# Patient Record
Sex: Male | Born: 1995 | Race: White | Hispanic: No | Marital: Single | Smoking: Never smoker
Health system: Southern US, Community
[De-identification: ages and names within clinical notes are randomized; demographics above are authoritative.]

## PROBLEM LIST (undated history)

## (undated) DIAGNOSIS — T7840XA Allergy, unspecified, initial encounter: Secondary | ICD-10-CM

## (undated) HISTORY — PX: TONSILLECTOMY: SUR1361

## (undated) HISTORY — DX: Allergy, unspecified, initial encounter: T78.40XA

---
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
## 2015-11-27 ENCOUNTER — Ambulatory Visit (INDEPENDENT_AMBULATORY_CARE_PROVIDER_SITE_OTHER): Payer: BLUE CROSS/BLUE SHIELD | Admitting: Physician Assistant

## 2015-11-27 VITALS — BP 130/70 | HR 92 | Temp 97.3°F | Resp 16 | Ht 69.5 in | Wt 157.0 lb

## 2015-11-27 DIAGNOSIS — H6982 Other specified disorders of Eustachian tube, left ear: Secondary | ICD-10-CM | POA: Diagnosis not present

## 2015-11-27 DIAGNOSIS — J209 Acute bronchitis, unspecified: Secondary | ICD-10-CM

## 2015-11-27 DIAGNOSIS — J4520 Mild intermittent asthma, uncomplicated: Secondary | ICD-10-CM

## 2015-11-27 DIAGNOSIS — H6992 Unspecified Eustachian tube disorder, left ear: Secondary | ICD-10-CM

## 2015-11-27 MED ORDER — ALBUTEROL SULFATE HFA 108 (90 BASE) MCG/ACT IN AERS: 2.0000 | INHALATION_SPRAY | Freq: Four times a day (QID) | RESPIRATORY_TRACT | Status: AC | PRN

## 2015-11-27 MED ORDER — BENZONATATE 100 MG PO CAPS: 100.0000 mg | ORAL_CAPSULE | Freq: Three times a day (TID) | ORAL | Status: DC | PRN

## 2015-11-27 MED ORDER — IPRATROPIUM BROMIDE 0.03 % NA SOLN: 2.0000 | Freq: Two times a day (BID) | NASAL | Status: DC

## 2015-11-27 MED ORDER — HYDROCOD POLST-CPM POLST ER 10-8 MG/5ML PO SUER
5.0000 mL | Freq: Two times a day (BID) | ORAL | Status: DC | PRN
Start: 2015-11-27 — End: 2016-02-03

## 2015-11-27 MED ORDER — AZITHROMYCIN 250 MG PO TABS
ORAL_TABLET | ORAL | Status: AC
Start: 2015-11-27 — End: 2015-12-02

## 2015-11-27 NOTE — Progress Notes (Signed)
[facility] [address], [place] [id number] [phone]  Date:  11/27/2015   Name:  Dylan ChristmasBryce M Worrel   DOB:  07/17/1996   MRN:  [id number]  PCP:  No PCP Per Patient    Chief Complaint: Hearing Loss; Nasal Congestion; and Cough   History of Present Illness:  This is a 20 y.o. male with PMH allergic rhinitis who is presenting with nasal congestion and cough x 2 weeks. States cough is productive of dark yellow/green mucus. Feels like symptoms are staying the same. Had fever for first 3 days of illness. No fever since. Gets wheezing every now and then. Does have a hx of asthma. States he has not had an inhaler in 6 years. No sob. States he is feeling very fatigued since this started. 1 week ago started having left ear fullness. No pain.  Denies sore throat.  Aggravating/alleviating factors: dayquil and not helping. History of env allergies: yes, uses flonase and claritin year round. Tobacco use: no  Review of Systems:  Review of Systems See HPI  There are no active problems to display for this patient.   Prior to Admission medications   Medication Sig Start Date End Date Taking? Authorizing Provider  loratadine (CLARITIN) 10 MG tablet Take 10 mg by mouth daily.   Yes Historical Provider, MD                         No Known Allergies  Past Surgical History  Procedure Laterality Date  . Tonsillectomy      Social History  Substance Use Topics  . Smoking status: Never Smoker   . Smokeless tobacco: None  . Alcohol Use: No    History reviewed. No pertinent family history.  Medication list has been reviewed and updated.  Physical Examination:  Physical Exam  Constitutional: He is oriented to person, place, and time. He appears well-developed and well-nourished. No distress.  HENT:  Head: Normocephalic and atraumatic.  Right Ear: Hearing, tympanic membrane, external ear and ear canal normal.  Left Ear: Hearing, external ear and ear canal  normal. Tympanic membrane is retracted.  Nose: Mucosal edema present. Right sinus exhibits no maxillary sinus tenderness and no frontal sinus tenderness. Left sinus exhibits no maxillary sinus tenderness and no frontal sinus tenderness.  Mouth/Throat: Uvula is midline, oropharynx is clear and moist and mucous membranes are normal.  Eyes: Conjunctivae and lids are normal. Right eye exhibits no discharge. Left eye exhibits no discharge. No scleral icterus.  Cardiovascular: Normal rate, regular rhythm, normal heart sounds and normal pulses.   No murmur heard. Pulmonary/Chest: Effort normal. No respiratory distress. He has no wheezes. He has rhonchi (Left sided). He has no rales.  Musculoskeletal: Normal range of motion.  Lymphadenopathy:       Head (right side): No submental, no submandibular and no tonsillar adenopathy present.       Head (left side): No submental, no submandibular and no tonsillar adenopathy present.    He has no cervical adenopathy.  Neurological: He is alert and oriented to person, place, and time.  Skin: Skin is warm, dry and intact. No lesion and no rash noted.  Psychiatric: He has a normal mood and affect. His speech is normal and behavior is normal. Thought content normal.   BP 130/70 mmHg  Pulse 92  Temp(Src) 97.3 F (36.3 C) (Oral)  Resp 16  Ht 5' 9.5" (1.765 m)  Wt 157 lb (71.215 kg)  BMI 22.86 kg/m2  SpO2 98%  Assessment and Plan:  1. ETD (eustachian tube dysfunction), left atrovent bid in addition to flonase daily. Sudafed 12 hour QAM for next 7-14 days. Continue claritin daily. Return if not getting better in 2-4 weeks. - ipratropium (ATROVENT) 0.03 % nasal spray; Place 2 sprays into both nostrils 2 (two) times daily.  Dispense: 30 mL; Refill: 0  2. Acute bronchitis, unspecified organism Treat with meds below. Return in 7-10 days if symptoms do not improve or at any time if symptoms worsen.  - azithromycin (ZITHROMAX) 250 MG tablet; Take 2 tabs PO x 1  dose, then 1 tab PO QD x 4 days  Dispense: 6 tablet; Refill: 0 - chlorpheniramine-HYDROcodone (TUSSIONEX PENNKINETIC ER) 10-8 MG/5ML SUER; Take 5 mLs by mouth every 12 (twelve) hours as needed for cough.  Dispense: 100 mL; Refill: 0 - benzonatate (TESSALON) 100 MG capsule; Take 1-2 capsules (100-200 mg total) by mouth 3 (three) times daily as needed for cough.  Dispense: 40 capsule; Refill: 0  3. Asthma, mild intermittent, uncomplicated Pt with mild int asthma but no inhaler at home. Refilled. - albuterol (PROVENTIL HFA;VENTOLIN HFA) 108 (90 Base) MCG/ACT inhaler; Inhale 2 puffs into the lungs every 6 (six) hours as needed for wheezing (cough, shortness of breath or wheezing.).  Dispense: 1 Inhaler; Refill: 1   Nelsie Domino V. Dyke Brackett, MHS [facility]  11/27/2015

## 2015-11-27 NOTE — Patient Instructions (Addendum)
Continue claritin and flonase Use atrovent nasal spray twice a day in addition to flonase. Take 12 hour sudafed once a day in the morning for next 1-2 weeks. Antibiotic as prescribed. Cough syrup at night for sleep. Drink plenty of water (64 oz/day) and get plenty of rest. If your symptoms are not improving in 7-10 days, return to clinic.  IF you received an x-ray today, you will receive an invoice from [facility]. Please contact [facility] at [phone] with questions or concerns regarding your invoice.   IF you received labwork today, you will receive an invoice from [facility]. Please contact [facility] at [phone] with questions or concerns regarding your invoice.   Our billing staff will not be able to assist you with questions regarding bills from these companies.  You will be contacted with the lab results as soon as they are available. The fastest way to get your results is to activate your My Chart account. Instructions are located on the last page of this paperwork. If you have not heard from us regarding the results in 2 weeks, please contact this office.

## 2016-02-03 ENCOUNTER — Ambulatory Visit (INDEPENDENT_AMBULATORY_CARE_PROVIDER_SITE_OTHER): Payer: BLUE CROSS/BLUE SHIELD

## 2016-02-03 ENCOUNTER — Ambulatory Visit (INDEPENDENT_AMBULATORY_CARE_PROVIDER_SITE_OTHER): Payer: BLUE CROSS/BLUE SHIELD | Admitting: Family Medicine

## 2016-02-03 VITALS — BP 136/70 | HR 111 | Temp 98.0°F | Resp 16 | Ht 69.5 in | Wt 158.8 lb

## 2016-02-03 DIAGNOSIS — M25561 Pain in right knee: Secondary | ICD-10-CM

## 2016-02-03 DIAGNOSIS — S8001XA Contusion of right knee, initial encounter: Secondary | ICD-10-CM

## 2016-02-03 NOTE — Patient Instructions (Addendum)
     IF you received an x-ray today, you will receive an invoice from [facility]. Please contact [facility] at [phone] with questions or concerns regarding your invoice.   IF you received labwork today, you will receive an invoice from [facility]. Please contact [facility] at [phone] with questions or concerns regarding your invoice.   Our billing staff will not be able to assist you with questions regarding bills from these companies.  You will be contacted with the lab results as soon as they are available. The fastest way to get your results is to activate your My Chart account. Instructions are located on the last page of this paperwork. If you have not heard from us regarding the results in 2 weeks, please contact this office.     I do not see any fractures or concerning findings on your knee x-ray today. There is a possibility that she just continues or bruises the kneecap. Okay for range of motion and gentle strengthening as we discussed. Over-the-counter naproxen or ibuprofen as needed, ice at the end of the day if needed, and Ace bandage if this feels better.  If you have any locking, giving way, or worsening pain, recommend recheck. In the meantime, be careful with lifts while dancing as there is a potential for instability.
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
## 2017-01-03 ENCOUNTER — Ambulatory Visit: Payer: BLUE CROSS/BLUE SHIELD

## 2017-01-08 ENCOUNTER — Ambulatory Visit: Payer: BLUE CROSS/BLUE SHIELD | Admitting: Family Medicine

## 2017-01-15 ENCOUNTER — Ambulatory Visit (INDEPENDENT_AMBULATORY_CARE_PROVIDER_SITE_OTHER): Payer: BLUE CROSS/BLUE SHIELD | Admitting: Family Medicine

## 2017-01-15 ENCOUNTER — Encounter: Payer: Self-pay | Admitting: Family Medicine

## 2017-01-15 VITALS — BP 131/81 | HR 78 | Temp 98.4°F | Resp 17 | Ht 69.5 in | Wt 162.0 lb

## 2017-01-15 DIAGNOSIS — L659 Nonscarring hair loss, unspecified: Secondary | ICD-10-CM | POA: Diagnosis not present

## 2017-01-15 DIAGNOSIS — L80 Vitiligo: Secondary | ICD-10-CM | POA: Diagnosis not present

## 2017-01-15 DIAGNOSIS — J4520 Mild intermittent asthma, uncomplicated: Secondary | ICD-10-CM | POA: Diagnosis not present

## 2017-01-15 DIAGNOSIS — L700 Acne vulgaris: Secondary | ICD-10-CM

## 2017-01-15 NOTE — Patient Instructions (Addendum)
I will check thyroid and autoimmune disease tests, and will let you know the results within the next 2 weeks.   Keep follow-up with your dermatologist.   IF you received an x-ray today, you will receive an invoice from [facility]. Please contact [facility] at [phone] with questions or concerns regarding your invoice.   IF you received labwork today, you will receive an invoice from [facility]. Please contact [facility] at [phone] with questions or concerns regarding your invoice.   Our billing staff will not be able to assist you with questions regarding bills from these companies.  You will be contacted with the lab results as soon as they are available. The fastest way to get your results is to activate your My Chart account. Instructions are located on the last page of this paperwork. If you have not heard from us regarding the results in 2 weeks, please contact this office.
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
## 2017-01-16 LAB — THYROID PANEL
FREE THYROXINE INDEX: 2 (ref 1.2–4.9)
T3 UPTAKE RATIO: 28 % (ref 24–39)
T4, Total: 7.1 ug/dL (ref 4.5–12.0)

## 2017-01-16 LAB — ANA,IFA RA DIAG PNL W/RFLX TIT/PATN
ANA Titer 1: NEGATIVE
Cyclic Citrullin Peptide Ab: 7 units (ref 0–19)
Rhuematoid fact SerPl-aCnc: <10 IU/mL (ref 0.0–13.9)

## 2017-09-16 IMAGING — CR DG KNEE COMPLETE 4+V*R*
4 series · 4 of 4 positions shown · non-contrast
Comparison: None.

CLINICAL DATA: Acute right knee pain after fall last week. Initial
encounter.

EXAM:
RIGHT KNEE - COMPLETE 4+ VIEW

[AP]
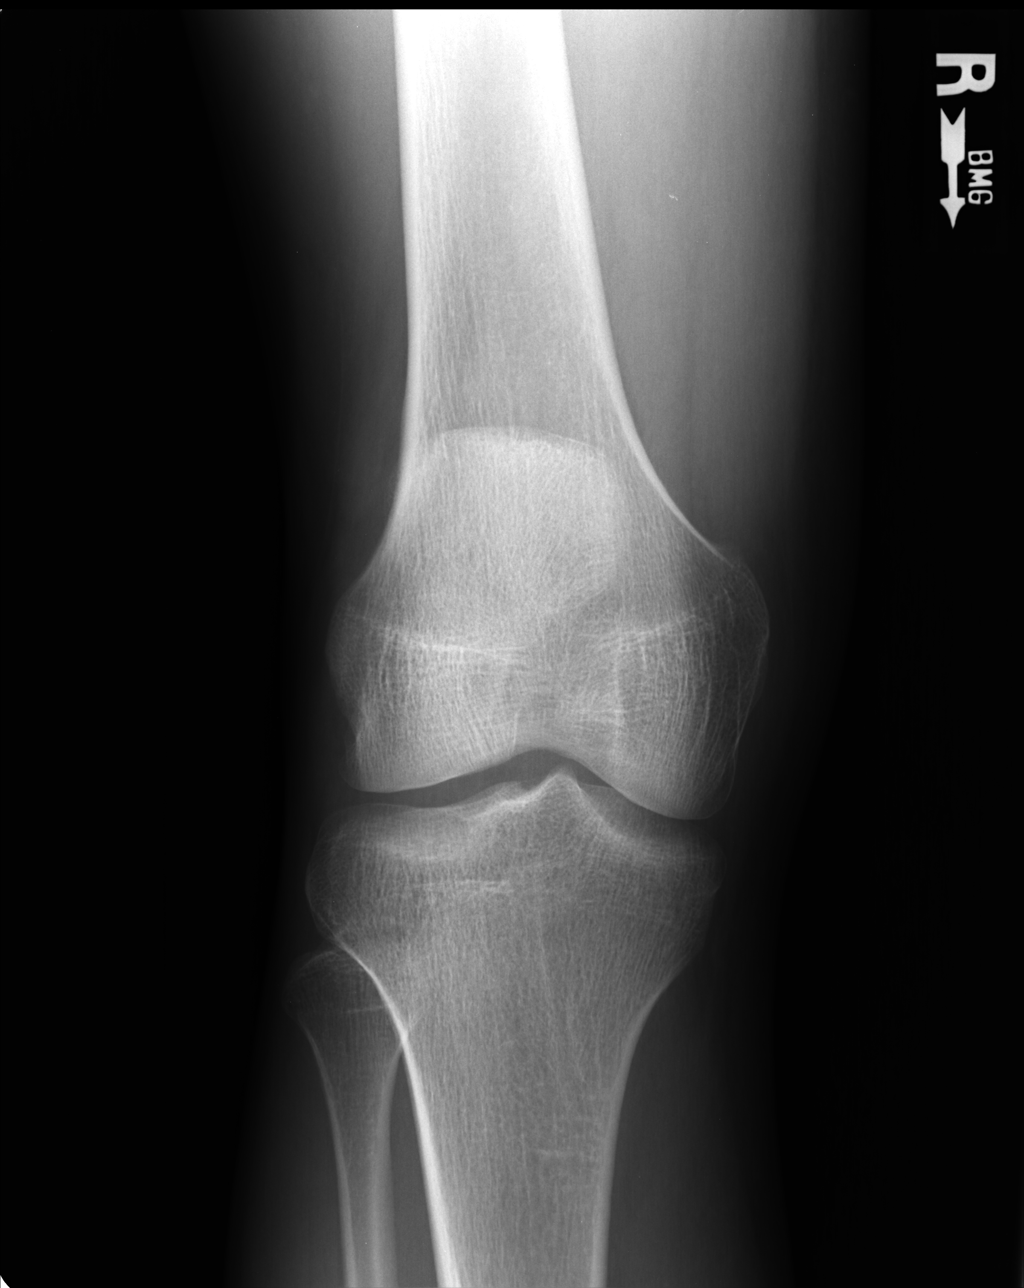

[ap axial]
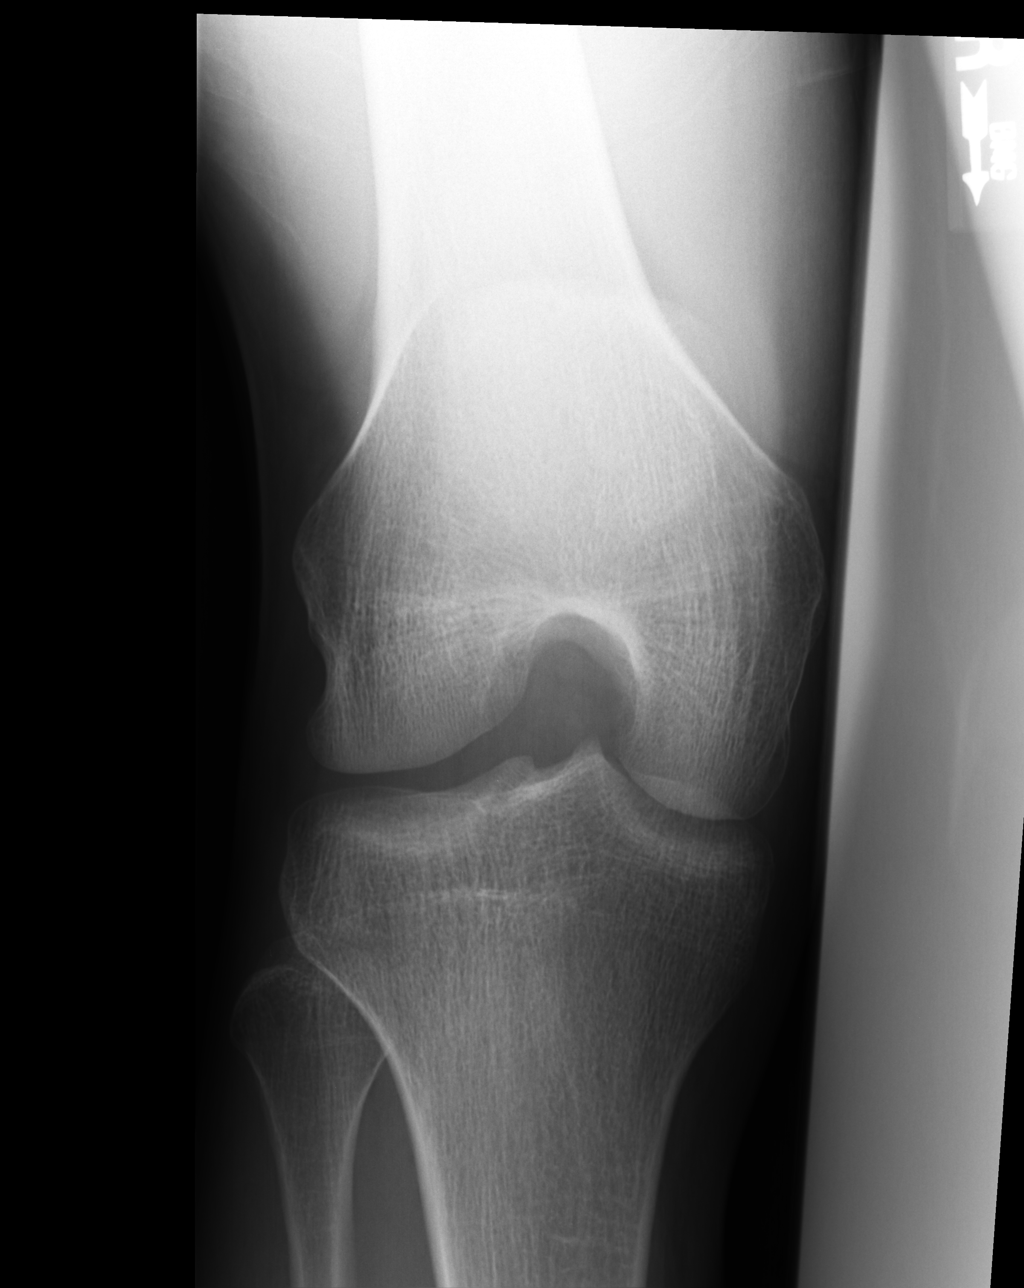

[lateral]
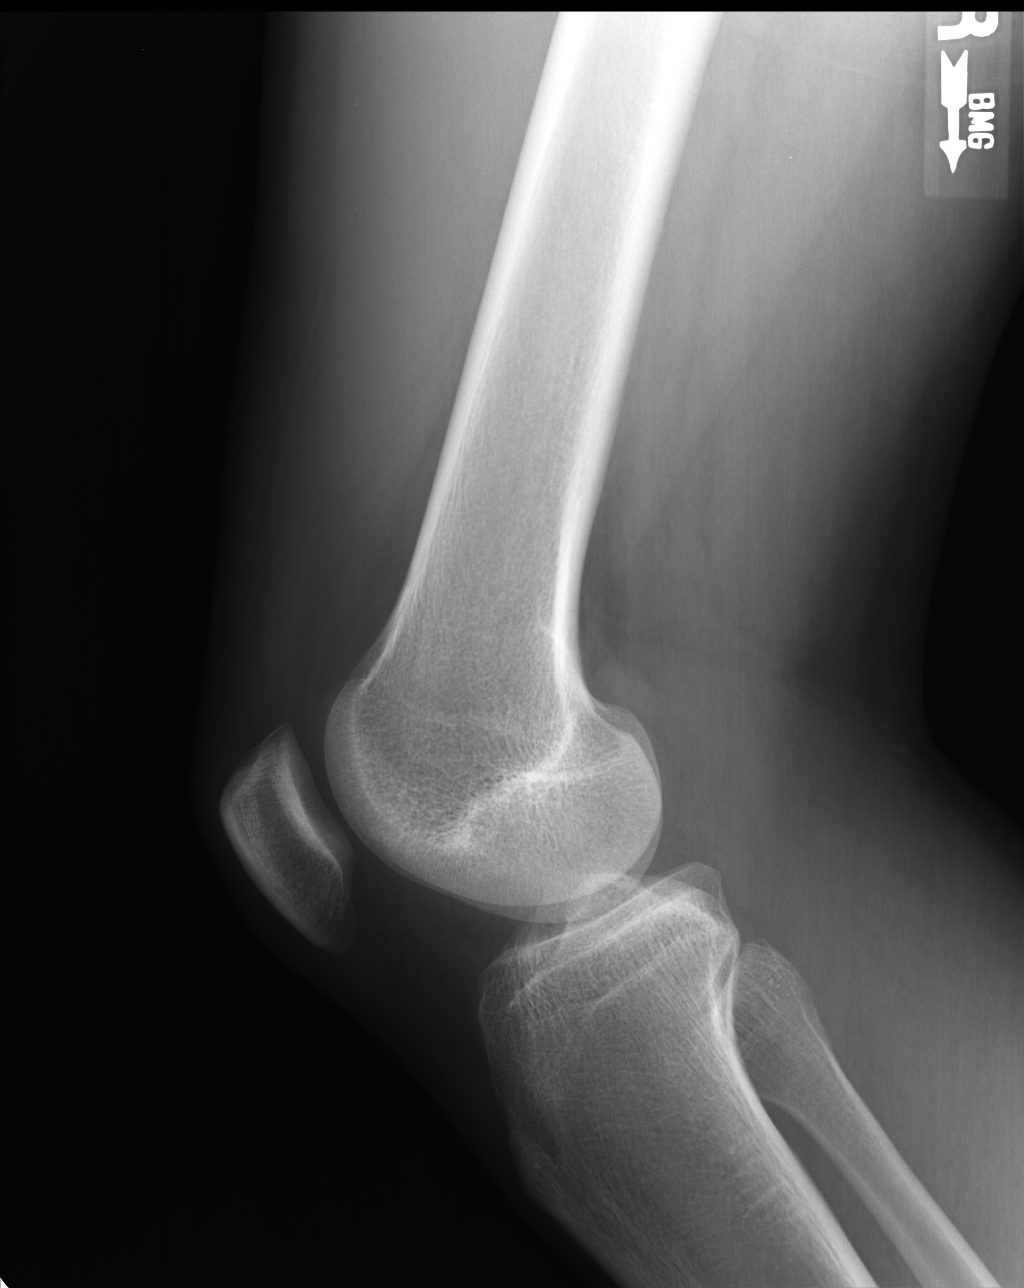

[sunrise]
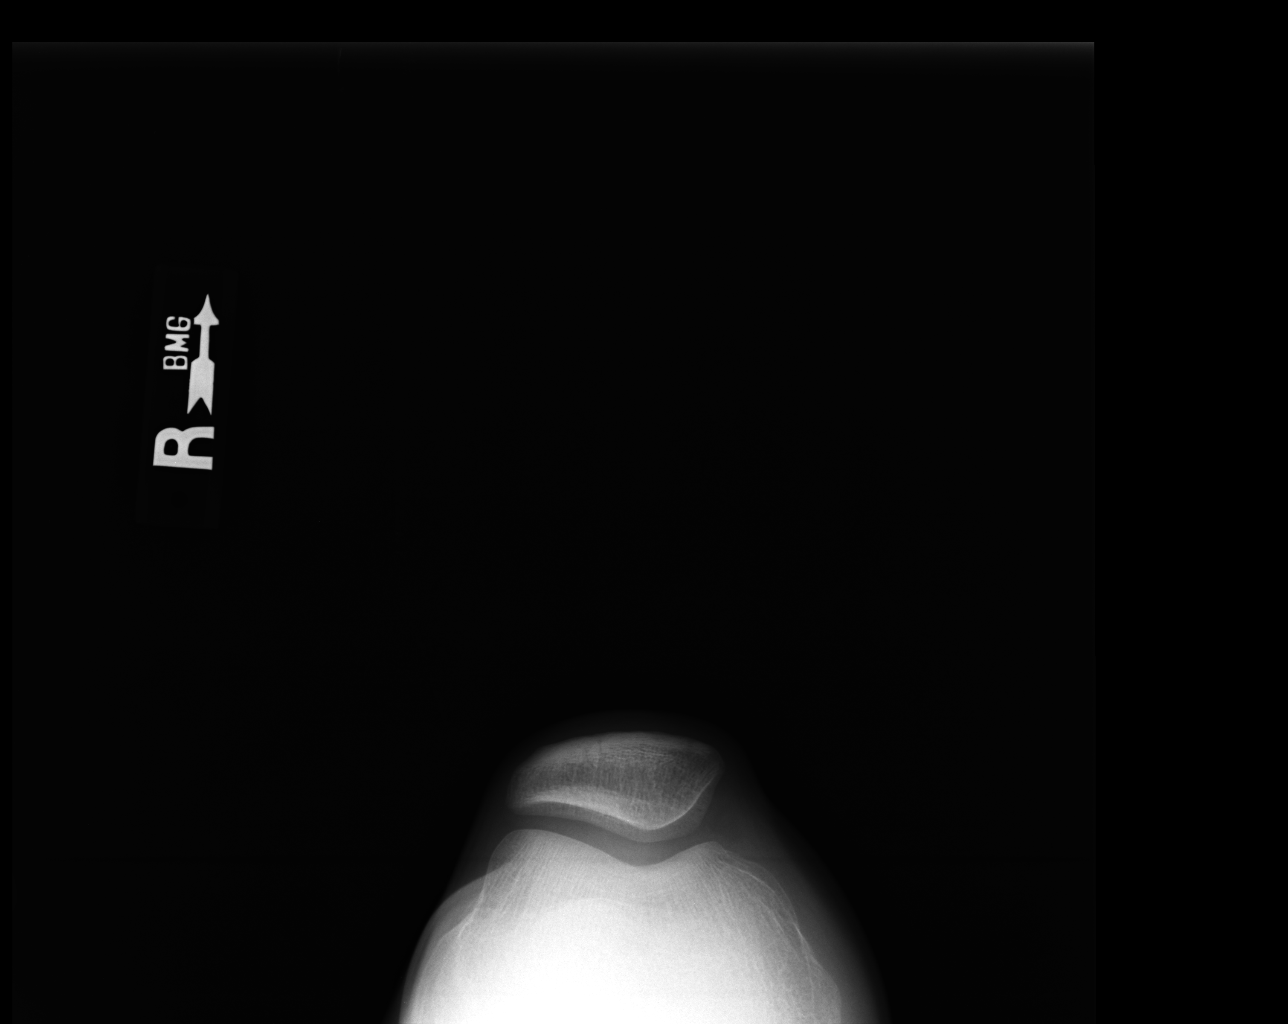

[4 of 4 positions shown; findings below may reference images not displayed]

FINDINGS: No evidence of fracture, dislocation, or joint effusion. No evidence
of arthropathy or other focal bone abnormality. Soft tissues are
unremarkable.
IMPRESSION: Normal right knee.

## 2017-11-22 ENCOUNTER — Other Ambulatory Visit: Payer: Self-pay

## 2017-11-22 ENCOUNTER — Encounter: Payer: Self-pay | Admitting: Physician Assistant

## 2017-11-22 ENCOUNTER — Ambulatory Visit: Payer: BLUE CROSS/BLUE SHIELD | Admitting: Physician Assistant

## 2017-11-22 VITALS — BP 143/84 | HR 73 | Temp 98.5°F | Resp 17 | Ht 69.5 in | Wt 158.0 lb

## 2017-11-22 DIAGNOSIS — L859 Epidermal thickening, unspecified: Secondary | ICD-10-CM

## 2017-11-22 MED ORDER — HYDROCORTISONE 2.5 % EX OINT: TOPICAL_OINTMENT | Freq: Two times a day (BID) | CUTANEOUS | 0 refills | Status: AC

## 2017-11-22 NOTE — Patient Instructions (Addendum)
apply the ointment twice daily until the lesions disappear.    IF you received an x-ray today, you will receive an invoice from [facility]. Please contact [facility] at [phone] with questions or concerns regarding your invoice.   IF you received labwork today, you will receive an invoice from [facility]. Please contact [facility] at [phone] with questions or concerns regarding your invoice.   Our billing staff will not be able to assist you with questions regarding bills from these companies.  You will be contacted with the lab results as soon as they are available. The fastest way to get your results is to activate your My Chart account. Instructions are located on the last page of this paperwork. If you have not heard from us regarding the results in 2 weeks, please contact this office.

## 2017-11-22 NOTE — Progress Notes (Signed)
    11/22/2017 9:56 AM   DOB: 11/29/1995 / MRN: [id number]  SUBJECTIVE:  Dylan Parker is a 22 y.o. male presenting for a few small lesions about the right skin overlying the second and third MCP.  He denies any pain.  He does have a history of eczema.  He has No Known Allergies.   He  has a past medical history of Allergy and Asthma.    He  reports that  has never smoked. he has never used smokeless tobacco. He reports that he does not drink alcohol or use drugs. He  reports that he does not engage in sexual activity. The patient  has a past surgical history that includes Tonsillectomy.  His family history is not on file.  ROS: He denies any weakness in rash elsewhere.  The problem list and medications were reviewed and updated by myself where necessary and exist elsewhere in the encounter.   OBJECTIVE:  BP (!) 143/84   Pulse 73   Temp 98.5 F (36.9 C) (Oral)   Resp 17   Ht 5' 9.5" (1.765 m)   Wt 158 lb (71.7 kg)   SpO2 98%   BMI 23.00 kg/m   Physical Exam  Constitutional: He appears well-developed. He is active and cooperative.  Non-toxic appearance.  Cardiovascular: Normal rate.  Pulmonary/Chest: Effort normal. No tachypnea.  Musculoskeletal:       Hands: Neurological: He is alert.  Skin: Skin is warm and dry. He is not diaphoretic. No pallor.  Vitals reviewed.   No results found for this or any previous visit (from the past 72 hour(s)).  No results found.  ASSESSMENT AND PLAN:  Dylan Parker was seen today for ? glass in r hand ring finger.  Diagnoses and all orders for this visit:  Hyperkeratosis  Other orders -     hydrocortisone 2.5 % ointment; Apply topically 2 (two) times daily.    The patient is advised to call or return to clinic if he does not see an improvement in symptoms, or to seek the care of the closest emergency department if he worsens with the above plan.   Deliah BostonMichael Clark, MHS, PA-C Primary Care at [facility] 11/22/2017 9:56 AM

## 2018-09-16 ENCOUNTER — Ambulatory Visit: Payer: BLUE CROSS/BLUE SHIELD | Admitting: Family Medicine

## 2018-09-16 ENCOUNTER — Encounter: Payer: Self-pay | Admitting: Family Medicine

## 2018-09-16 VITALS — BP 121/85 | HR 88 | Temp 98.5°F | Ht 69.5 in | Wt 149.4 lb

## 2018-09-16 DIAGNOSIS — G4700 Insomnia, unspecified: Secondary | ICD-10-CM | POA: Diagnosis not present

## 2018-09-16 MED ORDER — ZOLPIDEM TARTRATE 10 MG PO TABS: 10.0000 mg | ORAL_TABLET | Freq: Every evening | ORAL | 5 refills | Status: DC | PRN

## 2018-09-16 NOTE — Patient Instructions (Signed)
° ° ° °  If you have lab work done today you will be contacted with your lab results within the next 2 weeks.  If you have not heard from us then please contact us. The fastest way to get your results is to register for My Chart. ° ° °IF you received an x-ray today, you will receive an invoice from [facility]. Please contact [facility] at [phone] with questions or concerns regarding your invoice.  ° °IF you received labwork today, you will receive an invoice from [facility]. Please contact [facility] at [phone] with questions or concerns regarding your invoice.  ° °Our billing staff will not be able to assist you with questions regarding bills from these companies. ° °You will be contacted with the lab results as soon as they are available. The fastest way to get your results is to activate your My Chart account. Instructions are located on the last page of this paperwork. If you have not heard from us regarding the results in 2 weeks, please contact this office. °  ° ° ° °

## 2018-09-16 NOTE — Progress Notes (Signed)
   1/7/20206:47 PM  Dylan Parker 1996-04-27, 23 y.o. male [id number]  Chief Complaint  Patient presents with  . Insomnia    For the past 2 yrs. Has tried melatonin, does not work.    HPI:   Patient is a 23 y.o. male who presents today for insomnia  Has been going on for 2 years He reports that either twitches or catches himself falling asleep and wakes himself up He gets about 3 hours of sleep unless he takes [place] Was prescribed by doctor back in [place] where he goes to college He denies any snoring Denies any side effects from [place] Requesting refill pmp reviewed Plan is for sleep study in the summer  Fall Risk  09/16/2018 11/22/2017  Falls in the past year? 0 No     Depression screen [facility] 2/9 09/16/2018 11/22/2017 02/03/2016  Decreased Interest 0 0 0  Down, Depressed, Hopeless 0 0 0  PHQ - 2 Score 0 0 0    No Known Allergies  Prior to Admission medications   Medication Sig Start Date End Date Taking? Authorizing Provider  albuterol (PROVENTIL HFA;VENTOLIN HFA) 108 (90 Base) MCG/ACT inhaler Inhale 2 puffs into the lungs every 6 (six) hours as needed for wheezing (cough, shortness of breath or wheezing.). 11/27/15   Dorna Leitz, PA-C  fluticasone (FLONASE) 50 MCG/ACT nasal spray Place 2 sprays into the nose daily. 04/20/13   Ethelda Chick, MD  hydrocortisone 2.5 % ointment Apply topically 2 (two) times daily. 11/22/17   Silvestre Mesi    Past Medical History:  Diagnosis Date  . Allergy   . Asthma    Albuterol rarely use ;with exercise.    Past Surgical History:  Procedure Laterality Date  . TONSILLECTOMY      Social History   Tobacco Use  . Smoking status: Never Smoker  . Smokeless tobacco: Never Used  Substance Use Topics  . Alcohol use: No    History reviewed. No pertinent family history.  ROS Per hpi  OBJECTIVE:  Blood pressure 121/85, pulse 88, temperature 98.5 F (36.9 C), temperature source Oral, height 5' 9.5" (1.765 m), weight  149 lb 6.4 oz (67.8 kg), SpO2 96 %. Body mass index is 21.75 kg/m.   Physical Exam Vitals signs and nursing note reviewed.  Constitutional:      Appearance: He is well-developed.  HENT:     Head: Normocephalic and atraumatic.  Eyes:     Conjunctiva/sclera: Conjunctivae normal.     Pupils: Pupils are equal, round, and reactive to light.  Neck:     Musculoskeletal: Neck supple.  Pulmonary:     Effort: Pulmonary effort is normal.  Skin:    General: Skin is warm and dry.  Neurological:     Mental Status: He is alert and oriented to person, place, and time.      ASSESSMENT and PLAN  1. 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 Referring to sleep for further eval and treatment. He will plan to do this during summer break. Will cont with ambien for now as helping and not having any side effects.  - Ambulatory referral to Sleep Studies  Other orders - zolpidem (AMBIEN) 10 MG tablet; Take 1 tablet (10 mg total) by mouth at bedtime as needed for sleep.  Return in about 6 months (around 03/17/2019).    Myles Lipps, MD Primary Care at [facility] [address] [place], [place] [address] Ph.  [phone] Fax [phone]

## 2018-09-29 ENCOUNTER — Telehealth: Payer: Self-pay | Admitting: Family Medicine

## 2018-09-29 NOTE — Telephone Encounter (Signed)
Copied from CRM [id number]. Topic: Quick Communication - See Telephone Encounter >> Sep 29, 2018  3:36 PM Jens Som A wrote: CRM for notification. See Telephone encounter for: 09/29/18.  Patient is calling because he left his prescription at home for zolpidem (AMBIEN) 10 MG tablet [[id number]] Patient is requesting if zolpidem (AMBIEN) 10 MG tablet [[id number]] can be sent to [facility] [address] [place], [place]. [address] [phone]

## 2018-09-30 ENCOUNTER — Other Ambulatory Visit: Payer: Self-pay

## 2018-09-30 MED ORDER — ZOLPIDEM TARTRATE 10 MG PO TABS: 10.0000 mg | ORAL_TABLET | Freq: Every evening | ORAL | 0 refills | Status: DC | PRN

## 2018-09-30 NOTE — Telephone Encounter (Signed)
Rx printed and will fax today.

## 2018-10-01 NOTE — Telephone Encounter (Signed)
Spoke with pharmacy about refill, they states they will fill and call pt.

## 2018-10-01 NOTE — Telephone Encounter (Signed)
Pt states the pharmacy will not fill until the dr calls and ok since this was an early refill.  Please call [id number] - [place], [place] - [address] AT [address] [phone] (Phone) [phone] (Fax)

## 2018-10-13 ENCOUNTER — Telehealth: Payer: Self-pay | Admitting: Family Medicine

## 2018-10-13 NOTE — Telephone Encounter (Unsigned)
Copied from CRM [id number]. Topic: Quick Communication - Rx Refill/Question >> Oct 13, 2018  1:47 PM Gaynelle Adu wrote: Medication: zolpidem (AMBIEN) 10 MG tablet- patient stated   Has the patient contacted their pharmacy? Yes   Preferred Pharmacy (with phone number or street name): [id number] [place], [place] - [address] AT [facility] [address] [phone] (Phone) [phone] (Fax)    Agent: Please be advised that RX refills may take up to 3 business days. We ask that you follow-up with your pharmacy.

## 2018-10-14 NOTE — Telephone Encounter (Signed)
Please refill if possible

## 2018-10-26 ENCOUNTER — Other Ambulatory Visit: Payer: Self-pay | Admitting: Family Medicine

## 2018-10-26 MED ORDER — ZOLPIDEM TARTRATE 10 MG PO TABS: 10.0000 mg | ORAL_TABLET | Freq: Every evening | ORAL | 2 refills | Status: DC | PRN

## 2018-10-26 NOTE — Progress Notes (Signed)
pmp reviewed  Med refilled 

## 2018-10-26 NOTE — Telephone Encounter (Signed)
pmp reviewed Medication refilled 

## 2018-12-01 ENCOUNTER — Telehealth: Payer: Self-pay | Admitting: Family Medicine

## 2018-12-01 MED ORDER — ZOLPIDEM TARTRATE 10 MG PO TABS: 10.0000 mg | ORAL_TABLET | Freq: Every evening | ORAL | 2 refills | Status: DC | PRN

## 2018-12-01 NOTE — Telephone Encounter (Signed)
Please advise 

## 2018-12-01 NOTE — Telephone Encounter (Signed)
Copied from CRM [id number]. Topic: Quick Communication - Rx Refill/Question >> Dec 01, 2018  1:05 PM Wyonia Hough E wrote: Medication: zolpidem (AMBIEN) 10 MG tablet - Pt is not able to get back to [place] due to covid 19 and [facility] the [facility] [address]/ please advise   Has the patient contacted their pharmacy? Yes but was not able to reach anyone   Preferred Pharmacy (with phone number or street name): [id number] [place], [place] - [address] [phone] (Phone) [phone] (Fax)    Agent: Please be advised that RX refills may take up to 3 business days. We ask that you follow-up with your pharmacy.

## 2019-01-14 ENCOUNTER — Encounter: Payer: Self-pay | Admitting: Family Medicine

## 2019-01-14 ENCOUNTER — Other Ambulatory Visit: Payer: Self-pay

## 2019-01-14 ENCOUNTER — Ambulatory Visit: Payer: BC Managed Care – PPO | Admitting: Family Medicine

## 2019-01-14 VITALS — BP 130/80 | HR 74 | Temp 98.3°F | Resp 18 | Ht 70.28 in | Wt 155.2 lb

## 2019-01-14 DIAGNOSIS — H6123 Impacted cerumen, bilateral: Secondary | ICD-10-CM

## 2019-01-14 NOTE — Progress Notes (Signed)
Acute Office Visit  Subjective:    Patient ID: Dylan Parker, male    DOB: Jul 06, 1996, 23 y.o.   MRN: [id number]  Chief Complaint  Patient presents with  . Ear Pain    X 3 mth- pt states piece of silicone in ear    HPI Patient is in today for ear discomfort. Pt with concern for a foreign body left in his ear-pt states he uses silicone ear plugs as college roommates are loud. Pt states a piece of one was lodged in his right ear and he never was able to remove it. Now pt feeling tenderness in the right jaw and worried about infection. Pt states no fever. No ear pain. No h/o of hearing loss pt states he feels wax is causing muffled sounds now. Pt home from college due to [place] on line.   Past Medical History:  Diagnosis Date  . Allergy   . Asthma    Albuterol rarely use ;with exercise.    Past Surgical History:  Procedure Laterality Date  . TONSILLECTOMY      History reviewed. No pertinent family history.  Pt in college-home due to COVID-studying in [place]  Outpatient Medications Prior to Visit  Medication Sig Dispense Refill  . albuterol (PROVENTIL HFA;VENTOLIN HFA) 108 (90 Base) MCG/ACT inhaler Inhale 2 puffs into the lungs every 6 (six) hours as needed for wheezing (cough, shortness of breath or wheezing.). 1 Inhaler 1  . fluticasone (FLONASE) 50 MCG/ACT nasal spray Place 2 sprays into the nose daily. 16 g 3  . hydrocortisone 2.5 % ointment Apply topically 2 (two) times daily. 30 g 0  . zolpidem (AMBIEN) 10 MG tablet Take 1 tablet (10 mg total) by mouth at bedtime as needed for sleep. 30 tablet 2   No facility-administered medications prior to visit.     No Known Allergies  ROS    CONSTITUTIONAL: no  fever EENT: hearing  Muffled, no sinus problems, no nasal congestion,  ear  tenderness-right side with movement, no sore throat, no facial tenderness RESP: no SOB, no cough, no wheezing,  Objective:    Physical Exam BP 130/80 (BP Location: Right Arm,  Patient Position: Sitting, Cuff Size: Normal)   Pulse 74   Temp 98.3 F (36.8 C) (Oral)   Resp 18   Ht 5' 10.28" (1.785 m)   Wt 155 lb 3.2 oz (70.4 kg)   SpO2 98%   BMI 22.09 kg/m   General Appearance:    Alert, cooperative, no distress, appears stated age  Head:    Normocephalic, without obvious abnormality, atraumatic  Eyes:    PERRL, conjunctiva/corneas clear, EOM's intact, fundi    benign, both eyes  Ears:    Cerumen-bilat- Normal TM's and external ear canals, both ears noted after cerumen removal.   Nose:   Nares normal, septum midline, mucosa normal, no drainage    or sinus tenderness  Throat:   Lips, mucosa, and tongue normal; teeth and gums normal  Neck: No adenopathy     Lungs:     Clear to auscultation bilaterally, respirations unlabored  Chest Wall:    No tenderness or deformity   Heart:    Regular rate and rhythm, S1 and S2 normal, no murmur, rub   or gallop      Wt Readings from Last 3 Encounters:  01/14/19 155 lb 3.2 oz (70.4 kg)  09/16/18 149 lb 6.4 oz (67.8 kg)  11/22/17 158 lb (71.7 kg)   No results found for:  TSH No results found for: WBC, HGB, HCT, MCV, PLT No results found for: NA, K, CHLORIDE, CO2, GLUCOSE, BUN, CREATININE, BILITOT, ALKPHOS, AST, ALT, PROT, ALBUMIN, CALCIUM, ANIONGAP, EGFR, GFR No results found for: CHOL No results found for: HDL No results found for: LDLCALC No results found for: TRIG No results found for: CHOLHDL No results found for: HGBA1C     Assessment & Plan:   Problem List Items Addressed This Visit    None     Bilateral impacted cerumen  Cleared with no foreign body noted-wax impacted in bilat ear canals-discussed with pt ear cleaning. No plugs today  Hannah Beat, MD

## 2019-01-14 NOTE — Patient Instructions (Addendum)
  Pt will continue to see water from ear wash throughout the day. Dry ears -external with towel.  Allow to drain. No ear plugs today. Can use hair dryer on cool setting. Avoid using cotton tip appliances into ear canals.    If you have lab work done today you will be contacted with your lab results within the next 2 weeks.  If you have not heard from [place] then please contact us. The fastest way to get your results is to register for My Chart.   IF you received an x-ray today, you will receive an invoice from [facility]. Please contact [facility] at [phone] with questions or concerns regarding your invoice.   IF you received labwork today, you will receive an invoice from [place]. Please contact [facility] at [phone] with questions or concerns regarding your invoice.   Our billing staff will not be able to assist you with questions regarding bills from these companies.  You will be contacted with the lab results as soon as they are available. The fastest way to get your results is to activate your My Chart account. Instructions are located on the last page of this paperwork. If you have not heard from [place] regarding the results in 2 weeks, please contact this office.

## 2019-01-28 ENCOUNTER — Telehealth: Payer: Self-pay | Admitting: Family Medicine

## 2019-01-28 NOTE — Telephone Encounter (Signed)
Copied from CRM [id number]. Topic: Quick Communication - Rx Refill/Question >> Jan 28, 2019  3:20 PM Tamela Oddi wrote: Medication: zolpidem (AMBIEN) 10 MG tablet  Patient called to request a refill for the above medication  Preferred Pharmacy (with phone number [facility] street name[id number] [place], [place] - [address] [phone] (Phone) [phone] (Fax)

## 2019-01-29 NOTE — Telephone Encounter (Signed)
Spoke with pt to let him know he already has refill for this medication

## 2019-03-04 ENCOUNTER — Encounter: Payer: Self-pay | Admitting: Neurology

## 2019-03-04 ENCOUNTER — Telehealth: Payer: Self-pay | Admitting: Neurology

## 2019-03-04 NOTE — Telephone Encounter (Signed)
Called the patient to inform them that our office has placed new protocols in place for our office visits. Due to Covid 19 our office is reducing our number of office visits in order to minimize the risk to our patients and healthcare providers.Our office is now providing the capability to offer the patients virtual visits at this time. Informed of what that process looks like and informed that the Virtual visit will still be billed through insurance as such. Due to [place],informed the patient since the appointment is taking place over the phone/internet app, we can't guarantee the security of the phone line. With that said if we do move forward I would have to get verbal consent to complete the Video Visit/Phone call. Patient gave verbal consent to move forward with the video visit. I have reviewed the patient's chart and made sure that everything is up to date. Patient is also made aware that since this is a video visit we are able to complete the visit but a physical exam is not able to be done since the patient is not present in person. Reminded the patient once more that this is treated as a Office visit and the patient must be prepared for the visit and ready at the time of their appointment preferably in a well lit area where they have good connection for the visit. Pt verbalized understanding. Patient will be on mychart video visit. Apt corrected and message sent to the pt

## 2019-03-05 ENCOUNTER — Encounter: Payer: Self-pay | Admitting: Neurology

## 2019-03-10 ENCOUNTER — Encounter: Payer: Self-pay | Admitting: Neurology

## 2019-03-10 ENCOUNTER — Other Ambulatory Visit: Payer: Self-pay

## 2019-03-10 ENCOUNTER — Telehealth (INDEPENDENT_AMBULATORY_CARE_PROVIDER_SITE_OTHER): Payer: BC Managed Care – PPO | Admitting: Neurology

## 2019-03-10 DIAGNOSIS — F5104 Psychophysiologic insomnia: Secondary | ICD-10-CM | POA: Diagnosis not present

## 2019-03-10 MED ORDER — ALPRAZOLAM 0.5 MG PO TABS: 0.5000 mg | ORAL_TABLET | Freq: Every evening | ORAL | 0 refills | Status: DC | PRN

## 2019-03-10 NOTE — Patient Instructions (Signed)
Please remember to try to maintain good sleep hygiene, which means: Keep a regular sleep and wake schedule, try not to exercise or have a meal within 2 hours of your bedtime, and try to keep your bedroom conducive for sleep, that is:  cool and dark, without light distractors such as an illuminated alarm clock,  and refrain from watching TV/ screen on lab top, tablet or phone  right before sleep or in the middle of the night,  and do not keep the TV  on during the night. Consider listening to meditative music - non vocals -or to an audio book- this can induce pleasant dreams, and create "mindscapes' conducive to relaxation.   Also, try not to use or play on electronic devices at bedtime, such as your cell phone, tablet PC or laptop. If you wake up in the middle of the night, try to refrain from using these as well. If you like to read at bedtime use a BOOK - the kind that has pages- and an old incandescent light bulb- golden glow fosters sleep, blue cold  LED light keeps [place] awake.      We will look in the sleep study for EEG, EKG, EMG and oxygen , breathing and snoring, leg twitching and arousals.   For chronic insomnia, you are best followed by a psychiatrist and/or sleep psychologist.   We will call you with the sleep study results and make a follow up appointment .
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
## 2019-04-06 ENCOUNTER — Other Ambulatory Visit: Payer: Self-pay | Admitting: Neurology

## 2019-04-06 MED ORDER — ALPRAZOLAM 0.5 MG PO TABS: 0.5000 mg | ORAL_TABLET | Freq: Every evening | ORAL | 0 refills | Status: AC | PRN

## 2019-04-08 ENCOUNTER — Ambulatory Visit (INDEPENDENT_AMBULATORY_CARE_PROVIDER_SITE_OTHER): Payer: BC Managed Care – PPO | Admitting: Neurology

## 2019-04-08 ENCOUNTER — Other Ambulatory Visit: Payer: Self-pay

## 2019-04-08 DIAGNOSIS — G4733 Obstructive sleep apnea (adult) (pediatric): Secondary | ICD-10-CM

## 2019-04-08 DIAGNOSIS — F5104 Psychophysiologic insomnia: Secondary | ICD-10-CM | POA: Diagnosis not present

## 2019-04-13 NOTE — Procedures (Signed)
Dylan Parker, [place] DOB:      Mar 08, 1996      MR#:    [id number]     DATE OF RECORDING: 04/08/2019 REFERRING M.D.:  Delia Chimes, MD Study Performed:   Baseline Polysomnogram with expanded EEG  HISTORY: Dylan Parker is a 23 year old male patient, reporting sudden onset of insomnia 2 years ago- without a known trigger. He was seen in a video consultation on 03-10-2019, referred by Delia Chimes , MD.    Sleep and medical history: "some night's I would fall asleep and only slept for 1.5 hours and sometimes I can't fall asleep."  He describes himself dozing rather than sleeping. Has Allergic rhinitis, asthma, and anxiety.  Patient is not sleepy in daytime but tired. This is chronic insomnia, he I has taken anxiety medication in 2014. He has taken Ambien since 07-2018 and now it's no longer working but he was sleep walking.  The patient endorsed the Epworth Sleepiness Scale at 3 points.   The patient's weight 155 pounds with a height of 70 (inches), resulting in a BMI of 22.1 kg/m2. The patient's neck circumference measured 14 inches.  CURRENT MEDICATIONS: Albuterol, Flonase, Ambien   PROCEDURE:  This is a multichannel digital polysomnogram utilizing the Somnostar 11.2 system.  Electrodes and sensors were applied and monitored per AASM Specifications.   EEG, EOG, Chin and Limb EMG, were sampled at 200 Hz.  ECG, Snore and Nasal Pressure, Thermal Airflow, Respiratory Effort, CPAP Flow and Pressure, Oximetry was sampled at 50 Hz. Digital video and audio were recorded.      BASELINE STUDY:  The patient took Ambien and Xanax as sleep aids. Lights Out was at 01:03 and Lights On at 06:03.  Total recording time (TRT) was 300 minutes, with a total sleep time (TST) of 268.5 minutes.   The patient's sleep latency was 22 minutes.  REM latency was 178.5 minutes.  The sleep efficiency was 89.5 %.     SLEEP ARCHITECTURE: WASO (Wake after sleep onset) was 11.5 minutes.  There were 30 minutes in Stage N1, 173 minutes Stage  N2, 39.5 minutes Stage N3 and 26 minutes in Stage REM.  The percentage of Stage N1 was 11.2%, Stage N2 was 64.4%, Stage N3 was 14.7% and Stage R (REM sleep) was 9.7%.    RESPIRATORY ANALYSIS:  There were a total of 0 respiratory events:  0 obstructive apneas, 0 central apneas and 0 mixed apneas with a total of 0 apneas and an apnea index (AI) of 0 /hour. There were 0 hypopneas with a hypopnea index of 0 /hour.  The patient also had 1 respiratory event related arousal (RERA).      The total APNEA/HYPOPNEA INDEX (AHI) was 0 /hour and the total RESPIRATORY DISTURBANCE INDEX was 0. 2 /hour. The REM AHI was 0 /hour, versus a non-REM AHI of 0.0/h. The patient spent 167.5 minutes of total sleep time in the supine position and 101 minutes in non-supine. The supine AHI was 0.0 versus a non-supine AHI of 0.0.  OXYGEN SATURATION & C02:  The Wake baseline 02 saturation was 96%, with the lowest being 85%. Time spent below 89% saturation equaled 2 minutes.   PERIODIC LIMB MOVEMENTS:  The patient had a total of 0 Periodic Limb Movements.  The Periodic Limb Movement (PLM) index was 0 and the PLM Arousal index was 0/hour.  The arousals were noted as: 73 were spontaneous, 0 were associated with PLMs, and none (0) were associated with respiratory events.   Audio  and video analysis did not show any abnormal or unusual movements, behaviors, phonations or vocalizations.  The patient struggled in N1 sleep with intermittent brief arousals, sustained sleep was seen after 1.30 AM.  Some Snoring was noted. EKG was in keeping with normal sinus rhythm (NSR).  Post-study, the patient indicated that sleep was the same as usual.    IMPRESSION: I cannot identify an organic cause to the reported insomnia. Follow up within the sleep clinic is optional. Chronic insomnia is best treated by cognitive behavioral therapy.      I certify that I have reviewed the entire raw data recording prior to the issuance of this report in  accordance with the Standards of Accreditation of the American Academy of Sleep Medicine (AASM)      Melvyn Novasarmen Kalisi Bevill, MD  04-13-2019 Diplomat, American Board of Psychiatry and Neurology  Diplomat, American Board of Sleep Medicine Medical Director, [facility]

## 2019-04-14 ENCOUNTER — Telehealth: Payer: Self-pay | Admitting: *Deleted

## 2019-04-14 NOTE — Telephone Encounter (Signed)
Called pt and received no answer. VM box not setup.

## 2019-04-14 NOTE — Telephone Encounter (Signed)
-----   Message from Larey Seat, MD sent at 04/13/2019  2:01 PM EDT ----- Audio and video analysis did not show any abnormal or unusual  movements, behaviors, phonations or vocalizations. The patient  struggled in N1 sleep with intermittent brief arousals, sustained  sleep was seen after 1.30 AM.  Some Snoring was noted.  EKG was in keeping with normal sinus rhythm (NSR).   Addendum: Post-study, the patient indicated that sleep was shorter but somewhat better than  usual.    IMPRESSION:  I cannot identify an organic cause to the reported insomnia.  Follow up within the sleep clinic is optional. Chronic insomnia  is best treated by cognitive behavioral therapy.

## 2019-04-15 ENCOUNTER — Encounter: Payer: Self-pay | Admitting: Neurology

## 2019-04-15 ENCOUNTER — Telehealth: Payer: Self-pay | Admitting: Neurology

## 2019-04-15 NOTE — Telephone Encounter (Signed)
Called patient to discuss sleep study results. No answer at this time. Unable to LVM for the patient to call back due to no VM set up Ill send a mychart messafe as well.

## 2019-04-15 NOTE — Telephone Encounter (Signed)
-----   Message from Carmen Dohmeier, MD sent at 04/13/2019  2:01 PM EDT ----- Audio and video analysis did not show any abnormal or unusual  movements, behaviors, phonations or vocalizations. The patient  struggled in N1 sleep with intermittent brief arousals, sustained  sleep was seen after 1.30 AM.  Some Snoring was noted.  EKG was in keeping with normal sinus rhythm (NSR).   Addendum: Post-study, the patient indicated that sleep was shorter but somewhat better than  usual.    IMPRESSION:  I cannot identify an organic cause to the reported insomnia.  Follow up within the sleep clinic is optional. Chronic insomnia  is best treated by cognitive behavioral therapy. 

## 2019-04-24 ENCOUNTER — Encounter: Payer: Self-pay | Admitting: Family Medicine

## 2019-04-24 ENCOUNTER — Ambulatory Visit: Payer: BC Managed Care – PPO | Admitting: Family Medicine

## 2019-04-24 ENCOUNTER — Other Ambulatory Visit: Payer: Self-pay

## 2019-04-24 VITALS — BP 125/75 | HR 93 | Temp 97.4°F | Ht 69.0 in | Wt 156.4 lb

## 2019-04-24 DIAGNOSIS — F5104 Psychophysiologic insomnia: Secondary | ICD-10-CM | POA: Diagnosis not present

## 2019-04-24 MED ORDER — TRAZODONE HCL 50 MG PO TABS: 25.0000 mg | ORAL_TABLET | Freq: Every evening | ORAL | 3 refills | Status: DC | PRN

## 2019-04-24 NOTE — Patient Instructions (Signed)
° ° ° °  If you have lab work done today you will be contacted with your lab results within the next 2 weeks.  If you have not heard from us then please contact us. The fastest way to get your results is to register for My Chart. ° ° °IF you received an x-ray today, you will receive an invoice from [facility]. Please contact [facility] at [phone] with questions or concerns regarding your invoice.  ° °IF you received labwork today, you will receive an invoice from [facility]. Please contact [facility] at [phone] with questions or concerns regarding your invoice.  ° °Our billing staff will not be able to assist you with questions regarding bills from these companies. ° °You will be contacted with the lab results as soon as they are available. The fastest way to get your results is to activate your My Chart account. Instructions are located on the last page of this paperwork. If you have not heard from us regarding the results in 2 weeks, please contact this office. °  ° ° ° °

## 2019-04-24 NOTE — Progress Notes (Signed)
8/14/202010:50 AM  Lodema Pilot Pankratz 09-12-1995, 23 y.o., male [id number]  Chief Complaint  Patient presents with  . Insomnia    f/u. 2 weeks ago sleep study done     HPI:   Patient is a 23 y.o. male with past medical history significant for insmonia who presents today for followup after his sleep study  Last seen in Jan 2020  Referred to sleep medicine then Had sleep study done in July - normal, recommends CBT Has tried melatonin, tylenol PM and benadryl Leaves for college in [place] on sept 2nd  Depression screen [facility] 2/9 04/24/2019 01/14/2019 09/16/2018  Decreased Interest 0 0 0  Down, Depressed, Hopeless 0 0 0  PHQ - 2 Score 0 0 0    Fall Risk  04/24/2019 01/14/2019 09/16/2018 11/22/2017  Falls in the past year? 0 0 0 No  Number falls in past yr: 0 0 - -  Injury with Fall? 0 0 - -  Follow up Falls evaluation completed Falls evaluation completed - -     No Known Allergies  Prior to Admission medications   Medication Sig Start Date End Date Taking? Authorizing Provider  albuterol (PROVENTIL HFA;VENTOLIN HFA) 108 (90 Base) MCG/ACT inhaler Inhale 2 puffs into the lungs every 6 (six) hours as needed for wheezing (cough, shortness of breath or wheezing.). 11/27/15  Yes Ezekiel Slocumb, PA-C  ALPRAZolam (XANAX) 0.5 MG tablet Take 1 tablet (0.5 mg total) by mouth at bedtime as needed for anxiety (o refills). Bring to sleep lab and may take there. 04/06/19  Yes Dohmeier, Asencion Partridge, MD  fluticasone (FLONASE) 50 MCG/ACT nasal spray Place 2 sprays into the nose daily. 04/20/13  Yes Wardell Honour, MD  hydrocortisone 2.5 % ointment Apply topically 2 (two) times daily. 11/22/17  Yes Tereasa Coop, PA-C  zolpidem (AMBIEN) 10 MG tablet Take 1 tablet (10 mg total) by mouth at bedtime as needed for sleep. 12/01/18  Yes Rutherford Guys, MD    Past Medical History:  Diagnosis Date  . Allergy   . Asthma    Albuterol rarely use ;with exercise.    Past Surgical History:  Procedure Laterality Date   . TONSILLECTOMY      Social History   Tobacco Use  . Smoking status: Never Smoker  . Smokeless tobacco: Never Used  Substance Use Topics  . Alcohol use: No    No family history on file.  ROS Per hpi  OBJECTIVE:  Today's Vitals   04/24/19 1048  BP: 125/75  Pulse: 93  Temp: (!) 97.4 F (36.3 C)  TempSrc: Oral  SpO2: 97%  Weight: 156 lb 6.4 oz (70.9 kg)  Height: 5\' 9"  (1.753 m)   Body mass index is 23.1 kg/m.   Physical Exam Vitals signs and nursing note reviewed.  Constitutional:      Appearance: He is well-developed.  HENT:     Head: Normocephalic and atraumatic.  Eyes:     Conjunctiva/sclera: Conjunctivae normal.     Pupils: Pupils are equal, round, and reactive to light.  Neck:     Musculoskeletal: Neck supple.  Pulmonary:     Effort: Pulmonary effort is normal.  Skin:    General: Skin is warm and dry.  Neurological:     Mental Status: He is alert and oriented to person, place, and time.     ASSESSMENT and PLAN  1. Chronic insomnia Have reached out for recommendations for sleep psychologists. Trial of trazodone. Other orders - traZODone (DESYREL) 50  MG tablet; Take 0.5-1 tablets (25-50 mg total) by mouth at bedtime as needed for sleep.  Return in about 4 weeks (around 05/22/2019).    Myles LippsIrma M Santiago, MD [facility] [address] [place], [place] [address] Ph.  [phone] Fax [phone]

## 2019-04-29 ENCOUNTER — Telehealth: Payer: Self-pay | Admitting: Family Medicine

## 2019-04-29 ENCOUNTER — Encounter: Payer: Self-pay | Admitting: Family Medicine

## 2019-04-29 DIAGNOSIS — F5104 Psychophysiologic insomnia: Secondary | ICD-10-CM

## 2019-04-29 NOTE — Telephone Encounter (Signed)
Pt is wanting a referral for a sleep psychologist. 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 Please advise at [phone]

## 2019-05-01 NOTE — Telephone Encounter (Signed)
I understand but the soonest appointment I was able to make was the 28th of September and I know I can't function that long without any sleep before then.  Please advise if you are willing to prescribe any medication for sleep until his appointment. This message was from the [facility].

## 2019-05-11 NOTE — Telephone Encounter (Signed)
Rutherford Guys, MD  Reann Dobias, Asencion Partridge, MD        Good morning Dr Khaleem Burchill,   Patient just had sleep study that was normal.  Could you please recommend sleep psychologists in the area, as I am having difficulty finding one.   Thank you.  Rutherford Guys, MD    Hi Irma,   I like to use [facility] and also use [facility].   If a patient is mostly anxious and has been treated for depression and anxiety already, I use often biofeedback therapy.  Dr. Alba Cory  PhDprovides this.

## 2019-05-25 ENCOUNTER — Telehealth: Payer: BC Managed Care – PPO | Admitting: Family Medicine

## 2019-05-31 ENCOUNTER — Other Ambulatory Visit: Payer: Self-pay | Admitting: Neurology

## 2019-06-08 ENCOUNTER — Ambulatory Visit (INDEPENDENT_AMBULATORY_CARE_PROVIDER_SITE_OTHER): Payer: BC Managed Care – PPO | Admitting: Psychology

## 2019-06-08 DIAGNOSIS — F5101 Primary insomnia: Secondary | ICD-10-CM

## 2019-06-26 ENCOUNTER — Ambulatory Visit: Payer: BC Managed Care – PPO | Admitting: Psychology

## 2019-06-28 ENCOUNTER — Other Ambulatory Visit: Payer: Self-pay | Admitting: Family Medicine

## 2019-07-01 ENCOUNTER — Ambulatory Visit (INDEPENDENT_AMBULATORY_CARE_PROVIDER_SITE_OTHER): Payer: BC Managed Care – PPO | Admitting: Psychology

## 2019-07-01 DIAGNOSIS — F5101 Primary insomnia: Secondary | ICD-10-CM | POA: Diagnosis not present

## 2019-07-10 ENCOUNTER — Ambulatory Visit: Payer: BC Managed Care – PPO | Admitting: Psychology

## 2019-07-22 ENCOUNTER — Ambulatory Visit (INDEPENDENT_AMBULATORY_CARE_PROVIDER_SITE_OTHER): Payer: BC Managed Care – PPO | Admitting: Psychology

## 2019-07-22 DIAGNOSIS — F5101 Primary insomnia: Secondary | ICD-10-CM | POA: Diagnosis not present

## 2019-08-21 ENCOUNTER — Ambulatory Visit (INDEPENDENT_AMBULATORY_CARE_PROVIDER_SITE_OTHER): Payer: BC Managed Care – PPO | Admitting: Psychology

## 2019-08-21 DIAGNOSIS — F5101 Primary insomnia: Secondary | ICD-10-CM | POA: Diagnosis not present

## 2019-08-27 ENCOUNTER — Other Ambulatory Visit: Payer: Self-pay | Admitting: Family Medicine

## 2019-09-23 ENCOUNTER — Ambulatory Visit: Payer: BC Managed Care – PPO | Attending: Internal Medicine

## 2019-09-23 DIAGNOSIS — Z20822 Contact with and (suspected) exposure to covid-19: Secondary | ICD-10-CM

## 2019-09-24 ENCOUNTER — Ambulatory Visit: Payer: BC Managed Care – PPO | Admitting: Psychology

## 2019-09-24 LAB — NOVEL CORONAVIRUS, NAA: SARS-CoV-2, NAA: NOT DETECTED

## 2019-09-25 ENCOUNTER — Ambulatory Visit (INDEPENDENT_AMBULATORY_CARE_PROVIDER_SITE_OTHER): Payer: BC Managed Care – PPO | Admitting: Psychology

## 2019-09-25 DIAGNOSIS — F5101 Primary insomnia: Secondary | ICD-10-CM

## 2019-12-29 ENCOUNTER — Encounter: Payer: Self-pay | Admitting: Family Medicine

## 2019-12-29 MED ORDER — TRAZODONE HCL 50 MG PO TABS: 25.0000 mg | ORAL_TABLET | Freq: Every evening | ORAL | 1 refills | Status: DC | PRN

## 2019-12-29 NOTE — Telephone Encounter (Signed)
Patient is requesting a refill of the following medications: Requested Prescriptions    No prescriptions requested or ordered in this encounter  Trazadone 50 mg   Date of patient request: 12/29/2019 Last office visit: 04/23/2020 Date of last refill: 08/27/2019 Last refill amount: 30 tablets 3 refills Follow up time period per chart: 4 weeks, 05/22/2019   Pt is away for college

## 2020-03-31 ENCOUNTER — Other Ambulatory Visit: Payer: Self-pay | Admitting: Family Medicine

## 2020-03-31 NOTE — Telephone Encounter (Signed)
Requested  medications are  due for refill today yes  Requested medications are on the active medication list yes  Last refill 4/24  Last visit 04/2019  Future visit scheduled NO  Notes to clinic Failed protocol of valid visit within 6 months.

## 2020-04-01 ENCOUNTER — Encounter: Payer: Self-pay | Admitting: Family Medicine

## 2020-04-01 MED ORDER — TRAZODONE HCL 50 MG PO TABS: 25.0000 mg | ORAL_TABLET | Freq: Every evening | ORAL | 1 refills | Status: AC | PRN
